# Patient Record
Sex: Male | Born: 1985 | Race: White | Hispanic: No | Marital: Single | Smoking: Former smoker
Health system: Southern US, Community
[De-identification: ages and names within clinical notes are randomized; demographics above are authoritative.]

---

## 2004-06-04 ENCOUNTER — Ambulatory Visit: Payer: Self-pay | Admitting: Internal Medicine

## 2004-06-11 ENCOUNTER — Ambulatory Visit: Payer: Self-pay | Admitting: Internal Medicine

## 2004-12-24 ENCOUNTER — Ambulatory Visit: Payer: Self-pay | Admitting: Internal Medicine

## 2004-12-26 ENCOUNTER — Ambulatory Visit: Payer: Self-pay | Admitting: Internal Medicine

## 2005-01-02 ENCOUNTER — Ambulatory Visit: Payer: Self-pay | Admitting: Internal Medicine

## 2006-08-19 ENCOUNTER — Ambulatory Visit: Payer: Self-pay | Admitting: Internal Medicine

## 2006-08-19 LAB — CONVERTED CEMR LAB
BUN: 13 mg/dL (ref 6–23)
Basophils Absolute: 0 10*3/uL (ref 0.0–0.1)
Chloride: 104 meq/L (ref 96–112)
Eosinophils Relative: 1.3 % (ref 0.0–5.0)
GFR calc Af Amer: 154 mL/min
Glucose, Bld: 99 mg/dL (ref 70–99)
HCT: 43.2 % (ref 39.0–52.0)
HDL: 31.1 mg/dL — ABNORMAL LOW (ref >39.0)
LDL Cholesterol: 45 mg/dL (ref 0–99)
Neutro Abs: 1.9 10*3/uL (ref 1.4–7.7)
RBC: 4.68 M/uL (ref 4.22–5.81)
RDW: 11.9 % (ref 11.5–14.6)
Sodium: 141 meq/L (ref 135–145)
TSH: 2.61 microintl units/mL (ref 0.35–5.50)
VLDL: 92 mg/dL — ABNORMAL HIGH (ref 0–40)

## 2006-12-17 ENCOUNTER — Encounter: Payer: Self-pay | Admitting: Internal Medicine

## 2007-06-07 ENCOUNTER — Emergency Department (HOSPITAL_COMMUNITY): Admission: EM | Admit: 2007-06-07 | Discharge: 2007-06-07 | Payer: Self-pay | Admitting: Emergency Medicine

## 2009-12-19 ENCOUNTER — Emergency Department (HOSPITAL_COMMUNITY): Admission: EM | Admit: 2009-12-19 | Discharge: 2009-12-20 | Payer: Self-pay | Admitting: Emergency Medicine

## 2011-11-17 IMAGING — CR DG FOOT COMPLETE 3+V*L*
3 series · 3 of 3 positions shown · non-contrast
Comparison: None.

CLINICAL DATA: Fall with twisting injury, pain over the second
through fifth metatarsals

LEFT FOOT - COMPLETE 3+ VIEW

[t foot oblique left]
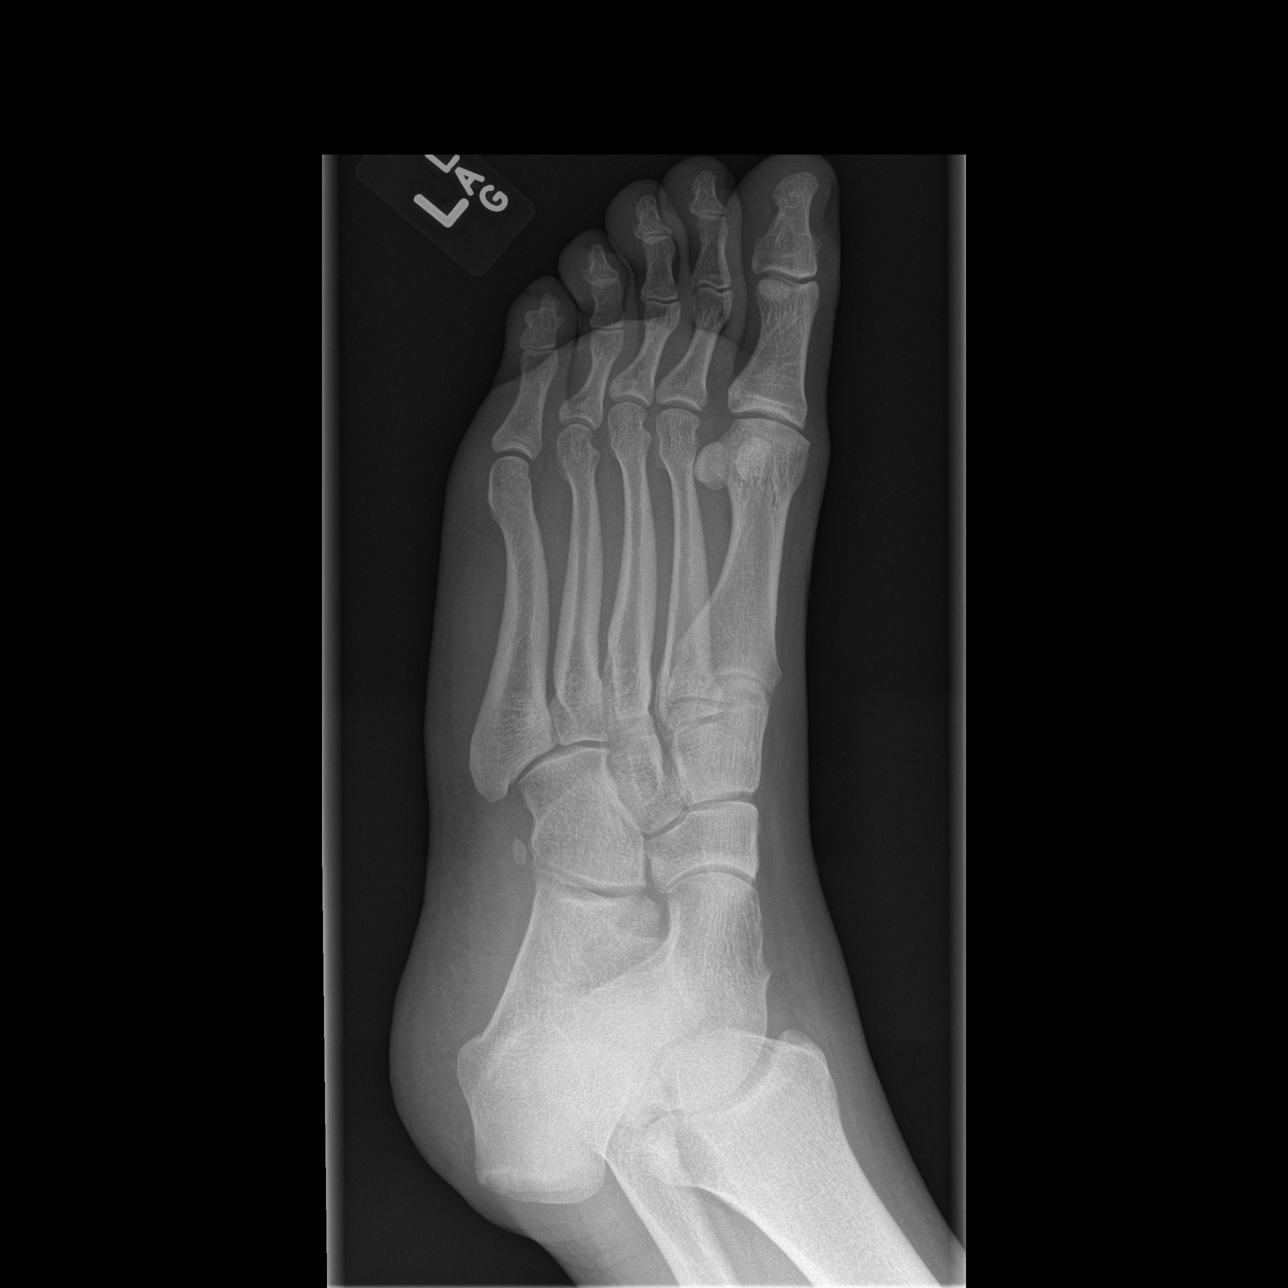

[t foot lat left]
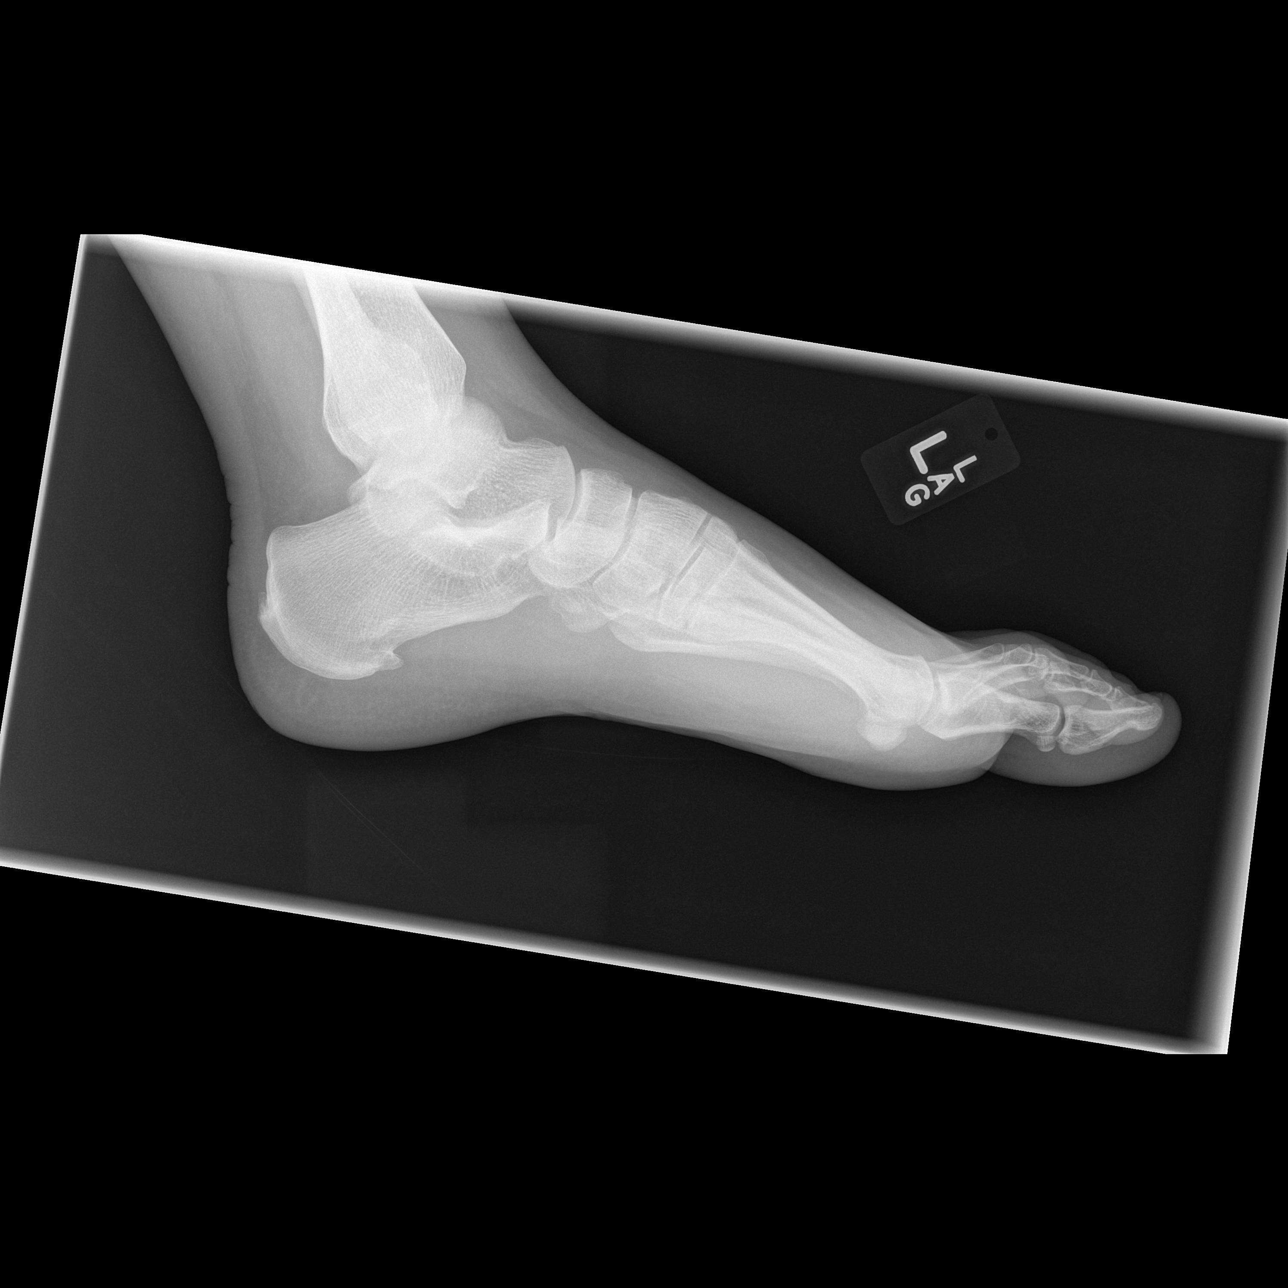

[t foot ap left]
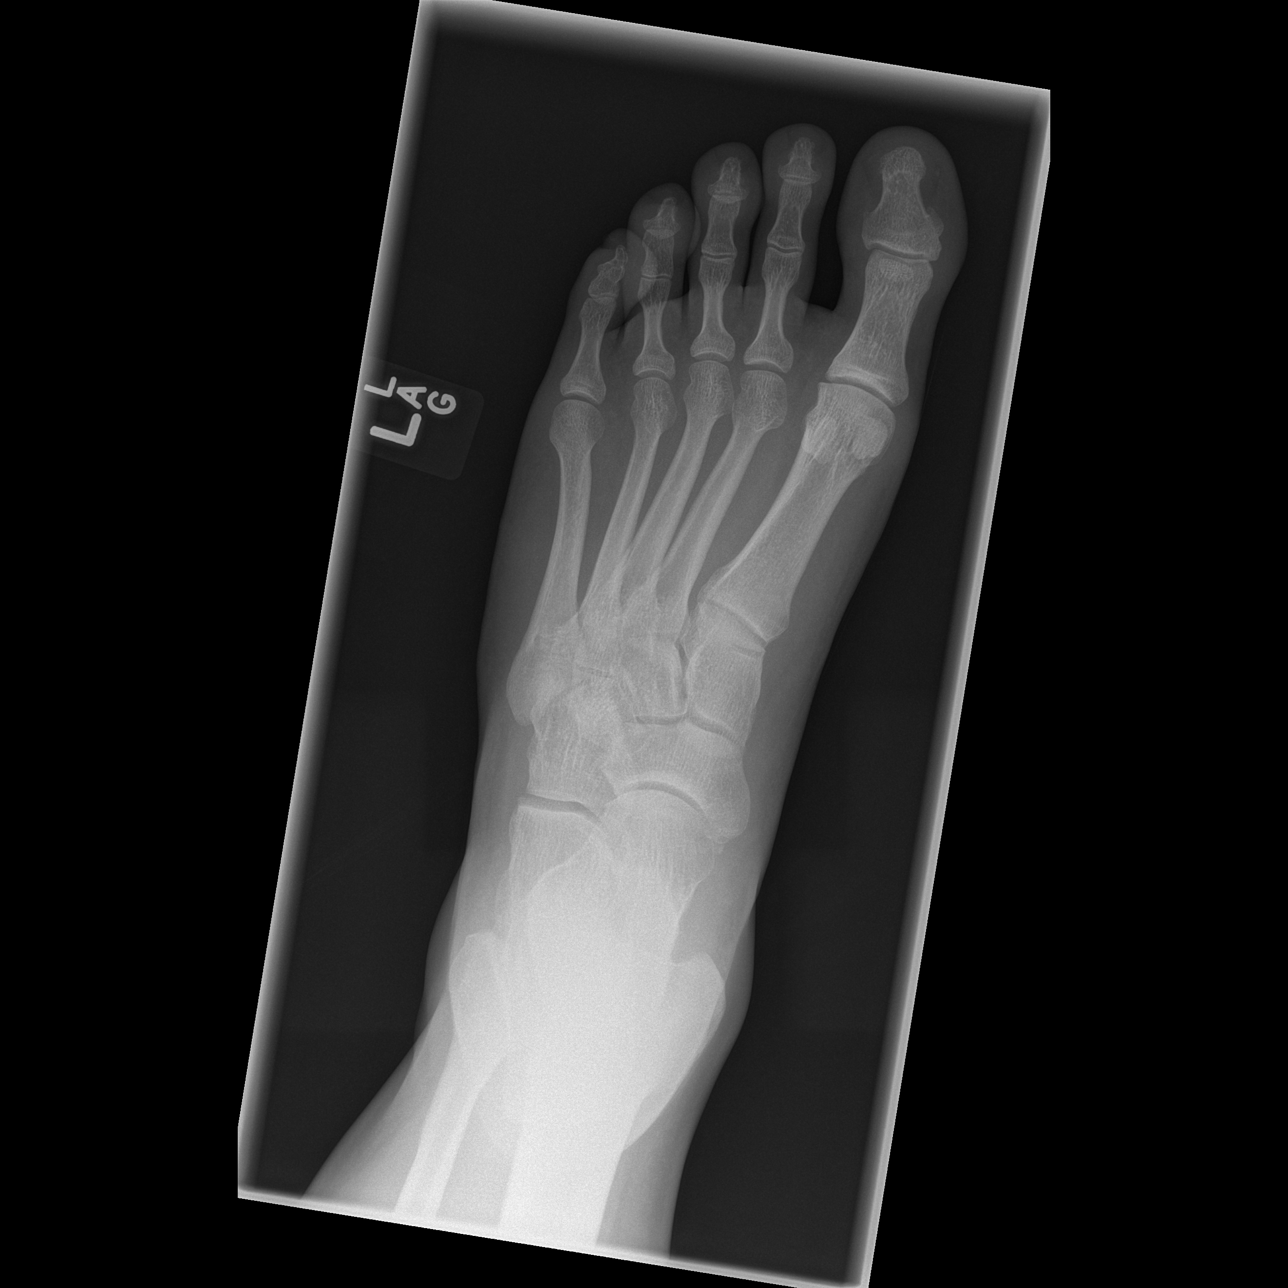

[3 of 3 positions shown; findings below may reference images not displayed]

FINDINGS: Mild plantar calcaneal spurring is noted. No fracture or
dislocation.  No soft tissue abnormality.  No radiopaque foreign
body.
IMPRESSION: No acute abnormality.

## 2013-05-24 ENCOUNTER — Emergency Department: Payer: Self-pay | Admitting: Emergency Medicine

## 2015-04-22 IMAGING — CR DG CHEST 2V
1 series · 1 of 1 positions shown · non-contrast
Comparison: None.

CLINICAL DATA: Productive cough with congestion and fever.

EXAM:
CHEST  2 VIEW

[pa]
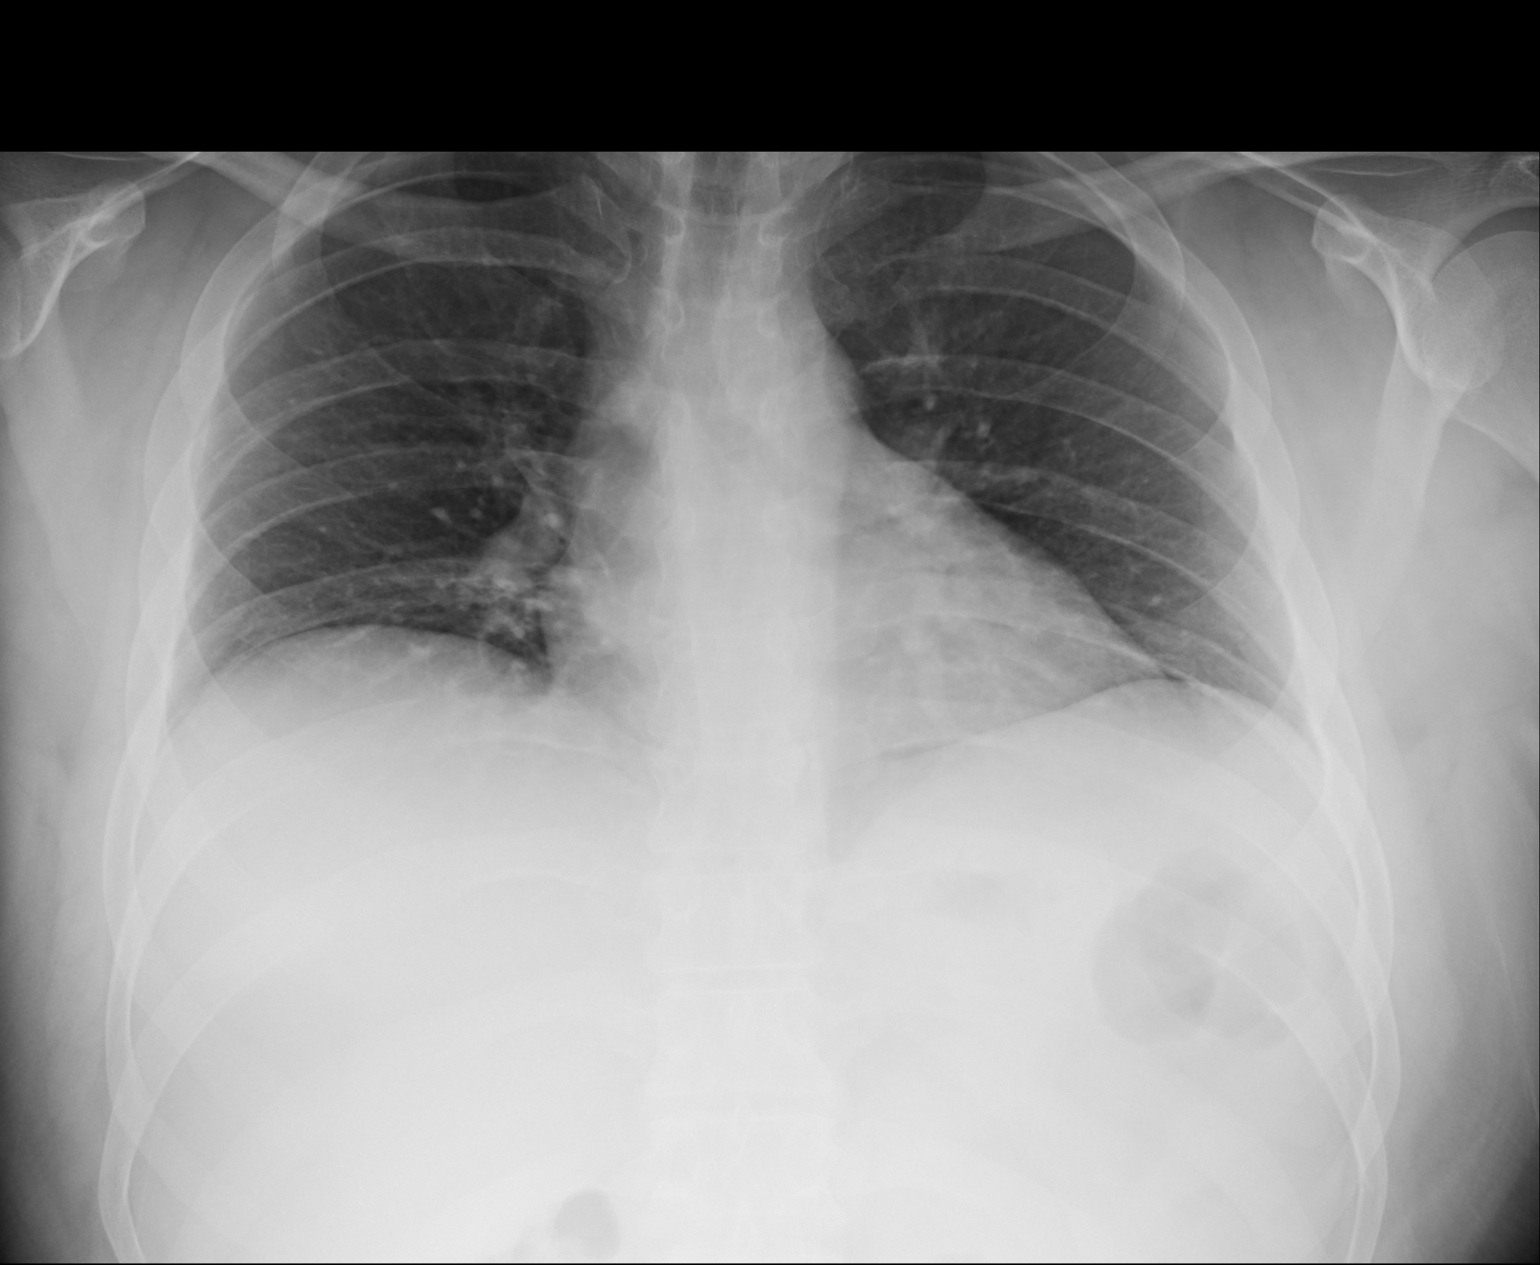

[1 of 1 positions shown; findings below may reference images not displayed]

FINDINGS: Lungs are somewhat hypoinflated but otherwise clear.
Cardiomediastinal silhouette is within normal. Bones and soft
tissues are within normal.
IMPRESSION: No active cardiopulmonary disease.

## 2021-01-15 DIAGNOSIS — E781 Pure hyperglyceridemia: Secondary | ICD-10-CM | POA: Insufficient documentation

## 2021-01-15 DIAGNOSIS — E1165 Type 2 diabetes mellitus with hyperglycemia: Secondary | ICD-10-CM | POA: Insufficient documentation

## 2021-01-22 ENCOUNTER — Ambulatory Visit: Payer: BC Managed Care – PPO | Admitting: Urology

## 2021-01-22 ENCOUNTER — Encounter: Payer: Self-pay | Admitting: Urology

## 2021-01-22 ENCOUNTER — Other Ambulatory Visit: Payer: Self-pay

## 2021-01-22 VITALS — BP 122/77 | HR 108 | Ht 74.0 in | Wt 264.0 lb

## 2021-01-22 DIAGNOSIS — N529 Male erectile dysfunction, unspecified: Secondary | ICD-10-CM | POA: Diagnosis not present

## 2021-01-22 MED ORDER — TADALAFIL 5 MG PO TABS: 5.0000 mg | ORAL_TABLET | Freq: Every day | ORAL | 11 refills | Status: DC

## 2021-01-22 NOTE — Progress Notes (Signed)
   01/22/21 12:38 PM   Dean Gauze Jr. 1986/04/27 [id number]  CC: Erectile dysfunction  HPI: 35 year old male referred for erectile dysfunction.  He was recently diagnosed with diabetes and started on metformin, hemoglobin A1c was 10.6.  He reports trouble with erections over the last 6 to 12 months.  He has tried some over-the-counter supplements without any improvement, but has not tried any prescription medications.  He denies any family history of prostate cancer.  Past medical history also notable for obesity with BMI 34.   Social History:  reports that he quit smoking about 15 years ago. His smoking use included cigarettes. He has never used smokeless tobacco. He reports that he does not drink alcohol and does not use drugs.  Physical Exam: BP 122/77 (BP Location: Left Arm, Patient Position: Sitting, Cuff Size: Large)   Pulse (!) 108   Ht 6\' 2"  (1.88 m)   Wt 264 lb (119.7 kg)   BMI 33.90 kg/m    Constitutional:  Alert and oriented, No acute distress. Cardiovascular: No clubbing, cyanosis, or edema. Respiratory: Normal respiratory effort, no increased work of breathing. GI: Abdomen is soft, nontender, nondistended, no abdominal masses   Assessment & Plan:   35 year old male with poorly controlled diabetes and new erectile dysfunction over the last year.  He has not tried any medications for this.  We reviewed the impact of obesity and diabetes on erections, and behavioral strategies were discussed at length.  We also reviewed the AUA guidelines regarding work-up and management of erectile dysfunction.  I recommended starting with a trial of Cialis 5 mg daily, and this can be increased up to 20 mg if needed.  We also discussed possible need for testosterone evaluation in the future.  He would like to follow-up on an as-needed basis.  Trial of Cialis 5 mg daily, okay to increase up to 20 mg as needed If no improvement, would recommend a.m. testosterone   30,  MD 01/22/2021  [facility] [address], [address] [place], [place] [id number]

## 2021-01-22 NOTE — Patient Instructions (Signed)
Erectile Dysfunction Erectile dysfunction (ED) is the inability to get or keep an erection in order to have sexual intercourse. ED is considered a symptom of an underlying disorder and is not considered a disease. ED may include: Inability to get an erection. Lack of enough hardness of the erection to allow penetration. Loss of erection before sex is finished. What are the causes? This condition may be caused by: Physical causes, such as: Artery problems. This may include heart disease, high blood pressure, atherosclerosis, and diabetes. Hormonal problems, such as low testosterone. Obesity. Nerve problems. This may include back or pelvic injuries, multiple sclerosis, Parkinson's disease, spinal cord injury, and stroke. Certain medicines, such as: Pain relievers. Antidepressants. Blood pressure medicines and water pills (diuretics). Cancer medicines. Antihistamines. Muscle relaxants. Lifestyle factors, such as: Use of drugs such as marijuana, cocaine, or opioids. Excessive use of alcohol. Smoking. Lack of physical activity or exercise. Psychological causes, such as: Anxiety or stress. Sadness or depression. Exhaustion. Fear about sexual performance. Guilt. What are the signs or symptoms? Symptoms of this condition include: Inability to get an erection. Lack of enough hardness of the erection to allow penetration. Loss of the erection before sex is finished. Sometimes having normal erections, but with frequent unsatisfactory episodes. Low sexual satisfaction in either partner due to erection problems. A curved penis occurring with erection. The curve may cause pain, or the penis may be too curved to allow for intercourse. Never having nighttime or morning erections. How is this diagnosed? This condition is often diagnosed by: Performing a physical exam to find other diseases or specific problems with the penis. Asking you detailed questions about the problem. Doing tests,  such as: Blood tests to check for diabetes mellitus or high cholesterol, or to measure hormone levels. Other tests to check for underlying health conditions. An ultrasound exam to check for scarring. A test to check blood flow to the penis. Doing a sleep study at home to measure nighttime erections. How is this treated? This condition may be treated by: Medicines, such as: Medicine taken by mouth to help you achieve an erection (oral medicine). Hormone replacement therapy to replace low testosterone levels. Medicine that is injected into the penis. Your health care provider may instruct you how to give yourself these injections at home. Medicine that is delivered with a short applicator tube. The tube is inserted into the opening at the tip of the penis, which is the opening of the urethra. A tiny pellet of medicine is put in the urethra. The pellet dissolves and enhances erectile function. This is also called MUSE (medicated urethral system for erections) therapy. Vacuum pump. This is a pump with a ring on it. The pump and ring are placed on the penis and used to create pressure that helps the penis become erect. Penile implant surgery. In this procedure, you may receive: An inflatable implant. This consists of cylinders, a pump, and a reservoir. The cylinders can be inflated with a fluid that helps to create an erection, and they can be deflated after intercourse. A semi-rigid implant. This consists of two silicone rubber rods. The rods provide some rigidity. They are also flexible, so the penis can both curve downward in its normal position and become straight for sexual intercourse. Blood vessel surgery to improve blood flow to the penis. During this procedure, a blood vessel from a different part of the body is placed into the penis to allow blood to flow around (bypass) damaged or blocked blood vessels. Lifestyle changes,   such as exercising more, losing weight, and quitting smoking. Follow  these instructions at home: Medicines  Take over-the-counter and prescription medicines only as told by your health care provider. Do not increase the dosage without first discussing it with your health care provider. If you are using self-injections, do injections as directed by your health care provider. Make sure you avoid any veins that are on the surface of the penis. After giving an injection, apply pressure to the injection site for 5 minutes. Talk to your health care provider about how to prevent headaches while taking ED medicines. These medicines may cause a sudden headache due to the increase in blood flow in your body. General instructions Exercise regularly, as directed by your health care provider. Work with your health care provider to lose weight, if needed. Do not use any products that contain nicotine or tobacco. These products include cigarettes, chewing tobacco, and vaping devices, such as e-cigarettes. If you need help quitting, ask your health care provider. Before using a vacuum pump, read the instructions that come with the pump and discuss any questions with your health care provider. Keep all follow-up visits. This is important. Contact a health care provider if: You feel nauseous. You are vomiting. You get sudden headaches while taking ED medicines. You have any concerns about your sexual health. Get help right away if: You are taking oral or injectable medicines and you have an erection that lasts longer than 4 hours. If your health care provider is unavailable, go to the nearest emergency room for evaluation. An erection that lasts much longer than 4 hours can result in permanent damage to your penis. You have severe pain in your groin or abdomen. You develop redness or severe swelling of your penis. You have redness spreading at your groin or lower abdomen. You are unable to urinate. You experience chest pain or a rapid heartbeat (palpitations) after taking oral  medicines. These symptoms may represent a serious problem that is an emergency. Do not wait to see if the symptoms will go away. Get medical help right away. Call your local emergency services (911 in the [place] Do not drive yourself to the hospital. Summary Erectile dysfunction (ED) is the inability to get or keep an erection during sexual intercourse. This condition is diagnosed based on a physical exam, your symptoms, and tests to determine the cause. Treatment varies depending on the cause and may include medicines, hormone therapy, surgery, or a vacuum pump. You may need follow-up visits to make sure that you are using your medicines or devices correctly. Get help right away if you are taking or injecting medicines and you have an erection that lasts longer than 4 hours. This information is not intended to replace advice given to you by your health care provider. Make sure you discuss any questions you have with your health care provider. Document Revised: 08/07/2020 Document Reviewed: 08/07/2020 Elsevier Patient Education  2022 Elsevier Inc.  Tadalafil Tablets (Erectile Dysfunction, BPH) What is this medication? TADALAFIL (tah DA la fil) treats erectile dysfunction (ED). It works by increasing blood flow to the penis, which helps to maintain an erection. It may also be used to treat symptoms of an enlarged prostate (benign prostatic hyperplasia). This medicine may be used for other purposes; ask your health care provider or pharmacist if you have questions. COMMON BRAND NAME(S): Adcirca, ALYQ, Cialis What should I tell my care team before I take this medication? They need to know if you have any of these   conditions: Anatomical deformation of the penis, Peyronie's disease, or history of priapism (painful and prolonged erection) Bleeding disorders Eye or vision problems, including a rare inherited eye disease called retinitis pigmentosa Heart disease, angina, a history of heart attack,  irregular heart beats, or other heart problems High or low blood pressure History of blood diseases, like sickle cell anemia or leukemia History of stomach bleeding Kidney disease Liver disease Stroke An unusual or allergic reaction to tadalafil, other medications, foods, dyes, or preservatives Pregnant or trying to get pregnant Breast-feeding How should I use this medication? Take this medication by mouth with a glass of water. Follow the directions on the prescription label. You may take this medication with or without meals. When this medication is used for erection problems, your care team may prescribe it to be taken once daily or as needed. If you are taking the medication as needed, you may be able to have sexual activity 30 minutes after taking it and for up to 36 hours after taking it. Whether you are taking the medication as needed or once daily, you should not take more than one dose per day. If you are taking this medication for symptoms of benign prostatic hyperplasia (BPH) or to treat both BPH and an erection problem, take the dose once daily at about the same time each day. Do not take your medication more often than directed. Talk to your care team about the use of this medication in children. Special care may be needed. Overdosage: If you think you have taken too much of this medicine contact a poison control center or emergency room at once. NOTE: This medicine is only for you. Do not share this medicine with others. What if I miss a dose? If you are taking this medication as needed for erection problems, this does not apply. If you miss a dose while taking this medication once daily for an erection problem, benign prostatic hyperplasia, or both, take it as soon as you remember, but do not take more than one dose per day. What may interact with this medication? Do not take this medication with any of the following: Nitrates like amyl nitrite, isosorbide dinitrate, isosorbide  mononitrate, nitroglycerin Other medications for erectile dysfunction like avanafil, sildenafil, vardenafil Other tadalafil products (Adcirca) Riociguat This medication may also interact with the following: Certain medications for high blood pressure Certain medications for the treatment of HIV infection or AIDS Certain medications used for fungal or yeast infections, like fluconazole, itraconazole, ketoconazole, and voriconazole Certain medications used for seizures like carbamazepine, phenytoin, and phenobarbital Grapefruit juice Macrolide antibiotics like clarithromycin, erythromycin, troleandomycin Medications for prostate problems Rifabutin, rifampin or rifapentine This list may not describe all possible interactions. Give your health care provider a list of all the medicines, herbs, non-prescription drugs, or dietary supplements you use. Also tell them if you smoke, drink alcohol, or use illegal drugs. Some items may interact with your medicine. What should I watch for while using this medication? If you notice any changes in your vision while taking this medication, call your care team as soon as possible. Stop using this medication and call your care team right away if you have a loss of sight in one or both eyes. Contact your care team right away if the erection lasts longer than 4 hours or if it becomes painful. This may be a sign of serious problem and must be treated right away to prevent permanent damage. If you experience symptoms of nausea, dizziness, chest pain or   arm pain upon initiation of sexual activity after taking this medication, you should refrain from further activity and call your care team as soon as possible. Do not drink alcohol to excess (examples, 5 glasses of wine or 5 shots of whiskey) when taking this medication. When taken in excess, alcohol can increase your chances of getting a headache or getting dizzy, increasing your heart rate or lowering your blood  pressure. Using this medication does not protect you or your partner against HIV infection (the virus that causes AIDS) or other sexually transmitted diseases. What side effects may I notice from receiving this medication? Side effects that you should report to your care team as soon as possible: Allergic reactions-skin rash, itching, hives, swelling of the face, lips, tongue, or throat Hearing loss or ringing in ears Heart attack-pain or tightness in the chest, shoulders, arms, or jaw, nausea, shortness of breath, cold or clammy skin, feeling faint or lightheaded Low blood pressure-dizziness, feeling faint or lightheaded, blurry vision Prolonged or painful erection Redness, blistering, peeling, or loosening of the skin, including inside the mouth Stroke-sudden numbness or weakness of the face, arm, or leg, trouble speaking, confusion, trouble walking, loss of balance or coordination, dizziness, severe headache, change in vision Sudden vision loss in one or both eyes Side effects that usually do not require medical attention (report to your care team if they continue or are bothersome): Back pain Facial flushing or redness Headache Muscle pain Runny or stuffy nose Upset stomach This list may not describe all possible side effects. Call your doctor for medical advice about side effects. You may report side effects to FDA at 1-800-FDA-1088. Where should I keep my medication? Keep out of the reach of children. Store at room temperature between 15 and 30 degrees C (59 and 86 degrees F). Throw away any unused medication after the expiration date. NOTE: This sheet is a summary. It may not cover all possible information. If you have questions about this medicine, talk to your doctor, pharmacist, or health care provider.  2022 Elsevier/Gold Standard (2020-07-01 14:49:19)  

## 2021-01-28 ENCOUNTER — Telehealth: Payer: Self-pay | Admitting: Urology

## 2021-01-28 NOTE — Telephone Encounter (Signed)
Called pt informed him that per note from Dr. Richardo Hanks he may titrate medication up to a max of 20mg . Patient states he has already increased to 15mg . Advised pt to try 20mg  call back with effectiveness.

## 2021-01-28 NOTE — Telephone Encounter (Signed)
Pt called office to let us know the current meds he's taking for ED is not working.  He wants to know if he can try anything different.

## 2021-02-14 MED ORDER — SILDENAFIL CITRATE 100 MG PO TABS: 100.0000 mg | ORAL_TABLET | Freq: Every day | ORAL | 11 refills | Status: DC | PRN

## 2022-05-26 ENCOUNTER — Other Ambulatory Visit: Payer: Self-pay | Admitting: Urology

## 2022-07-16 ENCOUNTER — Ambulatory Visit: Payer: BC Managed Care – PPO | Admitting: Urology

## 2022-07-16 VITALS — BP 150/94 | HR 90 | Ht 74.0 in | Wt 269.0 lb

## 2022-07-16 DIAGNOSIS — N529 Male erectile dysfunction, unspecified: Secondary | ICD-10-CM

## 2022-07-16 DIAGNOSIS — R102 Pelvic and perineal pain: Secondary | ICD-10-CM | POA: Diagnosis not present

## 2022-07-16 MED ORDER — TADALAFIL 20 MG PO TABS: 20.0000 mg | ORAL_TABLET | Freq: Every day | ORAL | 3 refills | Status: DC | PRN

## 2022-07-16 NOTE — Progress Notes (Signed)
   07/16/2022 8:43 AM   Dean Reed. Jul 13, 1985 [id number]  Reason for visit: Follow up erectile dysfunction, new groin pain  HPI: 37 year old male who I saw last in August 2022 for ED.  He has a number of comorbidities including obesity and diabetes and hemoglobin A1c was 10.6 at our last visit.  I recommended a trial of Cialis 5 mg daily, and considering a morning testosterone, but he never followed up.  Most recent hemoglobin A1c from December 2023 is improved to 6.5.  He ultimately increased up to the 20 mg Cialis on demand with good result.  He also was using 100 mg sildenafil intermittently as well.  We reviewed the risk and benefits of the Cialis for sildenafil, and he is more interested in pursuing the Cialis.  We discussed this could be taken every other day, or on an as-needed basis.  With his ED and diabetes, I also recommended checking a testosterone today, and will call with those results.  He also has had a few episodes of right-sided groin pain after ejaculations.  We discussed the concept of pelvic floor dysfunction, recommended NSAIDs and stretching exercises  -Cialis changed to 20 mg as needed -Call with testosterone results, if low would recommend behavioral strategies of weight loss, exercise, diet changes prior to considering medications -Behavioral strategies/NSAIDs for intermittent mild groin pain -He prefers as needed follow-up, Cialis can be filled by PCP moving forward  [place], [place] [address], [place], [place] [address] [phone]

## 2022-07-16 NOTE — Patient Instructions (Signed)
;  ax

## 2022-07-17 LAB — TESTOSTERONE: Testosterone: 316 ng/dL (ref 264–916)

## 2023-07-19 ENCOUNTER — Other Ambulatory Visit: Payer: Self-pay | Admitting: Urology

## 2023-09-16 ENCOUNTER — Other Ambulatory Visit: Payer: Self-pay

## 2023-11-04 ENCOUNTER — Ambulatory Visit: Payer: Self-pay | Admitting: Urology

## 2023-11-04 VITALS — BP 128/81 | HR 82 | Ht 75.5 in | Wt 271.2 lb

## 2023-11-04 DIAGNOSIS — N529 Male erectile dysfunction, unspecified: Secondary | ICD-10-CM

## 2023-11-04 MED ORDER — TADALAFIL 20 MG PO TABS: 20.0000 mg | ORAL_TABLET | Freq: Every day | ORAL | 3 refills | Status: AC | PRN

## 2023-11-04 NOTE — Progress Notes (Signed)
   11/04/2023 10:20 AM   Dean Reed. Jun 22, 1985 [id number]  Reason for visit: Follow up ED  HPI: 38 year old male who I saw previously in February 2024 for ED, he has a number of comorbidities including obesity, diabetes.  Testosterone  was normal at 316 and he opted for trial of Cialis .  He continues to use Cialis  20 mg every other day with good results for ED.  He would like to continue that medication.  Cialis  was refilled, can be filled by PCP moving forward  Lawerence Pressman, MD  [facility] [address], [address] [place], [place] [address] [phone]
